# Patient Record
Sex: Male | Born: 1995 | Race: Black or African American | Hispanic: No | Marital: Single | Smoking: Never smoker
Health system: Southern US, Community
[De-identification: ages and names within clinical notes are randomized; demographics above are authoritative.]

## PROBLEM LIST (undated history)

## (undated) DIAGNOSIS — Z8619 Personal history of other infectious and parasitic diseases: Secondary | ICD-10-CM

## (undated) DIAGNOSIS — E663 Overweight: Secondary | ICD-10-CM

## (undated) DIAGNOSIS — E108 Type 1 diabetes mellitus with unspecified complications: Principal | ICD-10-CM

## (undated) DIAGNOSIS — E1065 Type 1 diabetes mellitus with hyperglycemia: Principal | ICD-10-CM

## (undated) HISTORY — DX: Personal history of other infectious and parasitic diseases: Z86.19

## (undated) HISTORY — DX: Type 1 diabetes mellitus with hyperglycemia: E10.65

## (undated) HISTORY — DX: Overweight: E66.3

## (undated) HISTORY — DX: Type 1 diabetes mellitus with unspecified complications: E10.8

---
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
## 2020-01-27 LAB — HM DIABETES EYE EXAM

## 2020-02-21 LAB — HM DIABETES EYE EXAM

## 2022-08-06 ENCOUNTER — Ambulatory Visit (INDEPENDENT_AMBULATORY_CARE_PROVIDER_SITE_OTHER): Payer: BC Managed Care – PPO | Admitting: Family Medicine

## 2022-08-06 VITALS — BP 130/90 | HR 70 | Temp 98.7°F | Ht 69.29 in | Wt 194.3 lb

## 2022-08-06 DIAGNOSIS — R7303 Prediabetes: Secondary | ICD-10-CM | POA: Diagnosis not present

## 2022-08-06 DIAGNOSIS — Z8639 Personal history of other endocrine, nutritional and metabolic disease: Secondary | ICD-10-CM | POA: Diagnosis not present

## 2022-08-06 LAB — POCT GLYCOSYLATED HEMOGLOBIN (HGB A1C): Hemoglobin A1C: 5.8 % — AB (ref 4.0–5.6)

## 2022-08-06 NOTE — Progress Notes (Unsigned)
New Patient Office Visit  Subjective    Patient ID: [name] [name], male    DOB: 04-15-1996  Age: 27 y.o. MRN: [id number]  CC:  Chief Complaint  Patient presents with   New Patient (Initial Visit)    HPI [name] [name] presents to establish care Patient states that he used to be a diabetic. Was diagnosed in 2014, states that initially thought he was a type 1, then changed to type 2, then he reports he lost weight and his diabetes resolved. States that he occasionally checks blood sugar at home, no longer on medication.  A1C performed in office today and is 5.8. he reports he continues to eat a low carb diet and is doing well with this.   He reports no other medical issues and is not currently taking any prescribed medications.   I have reviewed all aspects of the patient's past medical, social, and surgical history.   Outpatient Encounter Medications as of 08/06/2022  Medication Sig   ACCU-CHEK FASTCLIX LANCETS MISC 1 each by Does not apply route daily. And lancets 1/day   acetone, urine, test strip Check ketones per protocol   glucagon 1 MG injection Use for Severe Hypoglycemia . Inject '1mg'$  intramuscularly if unresponsive, unable to swallow, unconscious and/or has seizure   No facility-administered encounter medications on file as of 08/06/2022.    Past Medical History:  Diagnosis Date   H/O varicella    also had vaccine   Overweight(278.02)    Type I (juvenile type) diabetes mellitus with unspecified complication, uncontrolled 04/02/2013   with  acidosis hosp    No past surgical history on file.  Family History  Problem Relation Age of Onset   Hypertension Mother    Asthma Father    Renal Disease Maternal Grandmother        on dialysis  recently acutely   Diabetes Maternal Grandmother     Social History   Socioeconomic History   Marital status: Single    Spouse name: n/a   Number of children: 0   Years of education: Not on file   Highest education level: Not on  file  Occupational History   Occupation: student  Tobacco Use   Smoking status: Never   Smokeless tobacco: Never  Substance and Sexual Activity   Alcohol use: No   Drug use: No   Sexual activity: Never  Other Topics Concern   Not on file  Social History Narrative   Lives with both parents.  His two half-sisters (one from each parent) are grown and live away.    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  Thinking about football next year.      hh of 3  No pets.             Social Determinants of Health   Financial Resource Strain: Not on file  Food Insecurity: Not on file  Transportation Needs: Not on file  Physical Activity: Not on file  Stress: Not on file  Social Connections: Not on file  Intimate Partner Violence: Not on file    Review of Systems  All other systems reviewed and are negative.       Objective    BP (!) 130/90 (BP Location: Left Arm, Patient Position: Sitting, Cuff Size: Normal)   Pulse  70   Temp 98.7 F (37.1 C) (Oral)   Ht 5' 9.29" (1.76 m)   Wt 194 lb 4.8 oz (88.1 kg)   SpO2 99%   BMI 28.45 kg/m   Physical Exam Vitals reviewed.  Constitutional:      Appearance: Normal appearance. He is well-groomed and normal weight.  Eyes:     Extraocular Movements: Extraocular movements intact.     Conjunctiva/sclera: Conjunctivae normal.  Neck:     Thyroid: No thyromegaly.  Cardiovascular:     Rate and Rhythm: Normal rate and regular rhythm.     Heart sounds: S1 normal and S2 normal. No murmur heard. Pulmonary:     Effort: Pulmonary effort is normal.     Breath sounds: Normal breath sounds and air entry. No rales.  Abdominal:     General: Abdomen is flat. Bowel sounds are normal.  Musculoskeletal:     Right lower leg: No edema.     Left lower leg: No edema.  Neurological:     General: No focal  deficit present.     Mental Status: He is alert and oriented to person, place, and time.     Gait: Gait is intact.  Psychiatric:        Mood and Affect: Mood and affect normal.         Assessment & Plan:   Problem List Items Addressed This Visit       Unprioritized   Prediabetes (Chronic)    Patient is well controlled on dietary measures only. At this point he only qualifies as a prediabetic NOT a full diabetic. I will order CMP and urine microalbumin test.       Other Visit Diagnoses     History of diabetes as a child    -  Primary   Relevant Orders   POC HgB A1c (Completed)   CMP (Completed)   Microalbumin/Creatinine Ratio, Urine (Completed)       Return in about 1 year (around 08/07/2023) for annual physical exam.   Farrel Conners, MD

## 2022-08-07 DIAGNOSIS — R7303 Prediabetes: Secondary | ICD-10-CM | POA: Insufficient documentation

## 2022-08-07 LAB — COMPREHENSIVE METABOLIC PANEL
ALT: 13 U/L (ref 0–53)
AST: 20 U/L (ref 0–37)
Albumin: 4.1 g/dL (ref 3.5–5.2)
Alkaline Phosphatase: 63 U/L (ref 39–117)
BUN: 8 mg/dL (ref 6–23)
CO2: 27 mEq/L (ref 19–32)
Calcium: 9.6 mg/dL (ref 8.4–10.5)
Chloride: 103 mEq/L (ref 96–112)
Creatinine, Ser: 0.75 mg/dL (ref 0.40–1.50)
GFR: 125.41 mL/min (ref >60.00)
Glucose, Bld: 81 mg/dL (ref 70–99)
Potassium: 4.1 mEq/L (ref 3.5–5.1)
Sodium: 137 mEq/L (ref 135–145)
Total Bilirubin: 0.6 mg/dL (ref 0.2–1.2)
Total Protein: 8.2 g/dL (ref 6.0–8.3)

## 2022-08-07 LAB — MICROALBUMIN / CREATININE URINE RATIO
Creatinine,U: 207.4 mg/dL
Microalb Creat Ratio: 0.6 mg/g (ref 0.0–30.0)
Microalb, Ur: 1.3 mg/dL (ref 0.0–1.9)

## 2022-08-07 NOTE — Assessment & Plan Note (Signed)
Patient is well controlled on dietary measures only. At this point he only qualifies as a prediabetic NOT a full diabetic. I will order CMP and urine microalbumin test.

## 2023-08-19 ENCOUNTER — Ambulatory Visit: Payer: BC Managed Care – PPO | Admitting: Family Medicine

## 2023-09-03 ENCOUNTER — Encounter: Admitting: Family Medicine

## 2023-09-04 ENCOUNTER — Encounter: Admitting: Family Medicine

## 2023-10-07 ENCOUNTER — Encounter: Admitting: Family Medicine

## 2023-11-04 ENCOUNTER — Encounter: Payer: Self-pay | Admitting: Family Medicine

## 2023-11-04 ENCOUNTER — Ambulatory Visit (INDEPENDENT_AMBULATORY_CARE_PROVIDER_SITE_OTHER): Payer: Self-pay | Admitting: Family Medicine

## 2023-11-04 VITALS — BP 120/70 | HR 55 | Temp 97.7°F | Ht 70.5 in | Wt 172.9 lb

## 2023-11-04 DIAGNOSIS — R7303 Prediabetes: Secondary | ICD-10-CM

## 2023-11-04 DIAGNOSIS — Z13220 Encounter for screening for lipoid disorders: Secondary | ICD-10-CM

## 2023-11-04 DIAGNOSIS — Z0001 Encounter for general adult medical examination with abnormal findings: Secondary | ICD-10-CM

## 2023-11-04 DIAGNOSIS — E010 Iodine-deficiency related diffuse (endemic) goiter: Secondary | ICD-10-CM

## 2023-11-04 LAB — CBC WITH DIFFERENTIAL/PLATELET
Basophils Absolute: 0 10*3/uL (ref 0.0–0.1)
Basophils Relative: 1.3 % (ref 0.0–3.0)
Eosinophils Absolute: 0.1 10*3/uL (ref 0.0–0.7)
Eosinophils Relative: 2.4 % (ref 0.0–5.0)
HCT: 41.1 % (ref 39.0–52.0)
Hemoglobin: 13.8 g/dL (ref 13.0–17.0)
Lymphocytes Relative: 43.9 % (ref 12.0–46.0)
Lymphs Abs: 1.5 10*3/uL (ref 0.7–4.0)
MCHC: 33.5 g/dL (ref 30.0–36.0)
MCV: 84.4 fl (ref 78.0–100.0)
Monocytes Absolute: 0.4 10*3/uL (ref 0.1–1.0)
Monocytes Relative: 11 % (ref 3.0–12.0)
Neutro Abs: 1.4 10*3/uL (ref 1.4–7.7)
Neutrophils Relative %: 41.4 % — ABNORMAL LOW (ref 43.0–77.0)
Platelets: 144 10*3/uL — ABNORMAL LOW (ref 150.0–400.0)
RBC: 4.86 Mil/uL (ref 4.22–5.81)
RDW: 13 % (ref 11.5–15.5)
WBC: 3.3 10*3/uL — ABNORMAL LOW (ref 4.0–10.5)

## 2023-11-04 LAB — LIPID PANEL
Cholesterol: 134 mg/dL (ref 0–200)
HDL: 55.4 mg/dL (ref >39.00)
LDL Cholesterol: 72 mg/dL (ref 0–99)
NonHDL: 78.69
Total CHOL/HDL Ratio: 2
Triglycerides: 31 mg/dL (ref 0.0–149.0)
VLDL: 6.2 mg/dL (ref 0.0–40.0)

## 2023-11-04 LAB — COMPREHENSIVE METABOLIC PANEL WITH GFR
ALT: 13 U/L (ref 0–53)
AST: 16 U/L (ref 0–37)
Albumin: 4.5 g/dL (ref 3.5–5.2)
Alkaline Phosphatase: 39 U/L (ref 39–117)
BUN: 10 mg/dL (ref 6–23)
CO2: 25 meq/L (ref 19–32)
Calcium: 9.3 mg/dL (ref 8.4–10.5)
Chloride: 102 meq/L (ref 96–112)
Creatinine, Ser: 0.73 mg/dL (ref 0.40–1.50)
GFR: 125.34 mL/min (ref >60.00)
Glucose, Bld: 79 mg/dL (ref 70–99)
Potassium: 4.3 meq/L (ref 3.5–5.1)
Sodium: 135 meq/L (ref 135–145)
Total Bilirubin: 1.7 mg/dL — ABNORMAL HIGH (ref 0.2–1.2)
Total Protein: 7.8 g/dL (ref 6.0–8.3)

## 2023-11-04 LAB — POCT GLYCOSYLATED HEMOGLOBIN (HGB A1C): Hemoglobin A1C: 5.5 % (ref 4.0–5.6)

## 2023-11-04 LAB — TSH: TSH: 1.96 u[IU]/mL (ref 0.35–5.50)

## 2023-11-04 NOTE — Patient Instructions (Signed)
 Health Maintenance, Male  Adopting a healthy lifestyle and getting preventive care are important in promoting health and wellness. Ask your health care provider about:  The right schedule for you to have regular tests and exams.  Things you can do on your own to prevent diseases and keep yourself healthy.  What should I know about diet, weight, and exercise?  Eat a healthy diet    Eat a diet that includes plenty of vegetables, fruits, low-fat dairy products, and lean protein.  Do not eat a lot of foods that are high in solid fats, added sugars, or sodium.  Maintain a healthy weight  Body mass index (BMI) is a measurement that can be used to identify possible weight problems. It estimates body fat based on height and weight. Your health care provider can help determine your BMI and help you achieve or maintain a healthy weight.  Get regular exercise  Get regular exercise. This is one of the most important things you can do for your health. Most adults should:  Exercise for at least 150 minutes each week. The exercise should increase your heart rate and make you sweat (moderate-intensity exercise).  Do strengthening exercises at least twice a week. This is in addition to the moderate-intensity exercise.  Spend less time sitting. Even light physical activity can be beneficial.  Watch cholesterol and blood lipids  Have your blood tested for lipids and cholesterol at 27 years of age, then have this test every 5 years.  You may need to have your cholesterol levels checked more often if:  Your lipid or cholesterol levels are high.  You are older than 27 years of age.  You are at high risk for heart disease.  What should I know about cancer screening?  Many types of cancers can be detected early and may often be prevented. Depending on your health history and family history, you may need to have cancer screening at various ages. This may include screening for:  Colorectal cancer.  Prostate cancer.  Skin cancer.  Lung  cancer.  What should I know about heart disease, diabetes, and high blood pressure?  Blood pressure and heart disease  High blood pressure causes heart disease and increases the risk of stroke. This is more likely to develop in people who have high blood pressure readings or are overweight.  Talk with your health care provider about your target blood pressure readings.  Have your blood pressure checked:  Every 3-5 years if you are 9-95 years of age.  Every year if you are 85 years old or older.  If you are between the ages of 29 and 29 and are a current or former smoker, ask your health care provider if you should have a one-time screening for abdominal aortic aneurysm (AAA).  Diabetes  Have regular diabetes screenings. This checks your fasting blood sugar level. Have the screening done:  Once every three years after age 23 if you are at a normal weight and have a low risk for diabetes.  More often and at a younger age if you are overweight or have a high risk for diabetes.  What should I know about preventing infection?  Hepatitis B  If you have a higher risk for hepatitis B, you should be screened for this virus. Talk with your health care provider to find out if you are at risk for hepatitis B infection.  Hepatitis C  Blood testing is recommended for:  Everyone born from [birth year] through 1964.  Anyone  with known risk factors for hepatitis C.  Sexually transmitted infections (STIs)  You should be screened each year for STIs, including gonorrhea and chlamydia, if:  You are sexually active and are younger than 27 years of age.  You are older than 27 years of age and your health care provider tells you that you are at risk for this type of infection.  Your sexual activity has changed since you were last screened, and you are at increased risk for chlamydia or gonorrhea. Ask your health care provider if you are at risk.  Ask your health care provider about whether you are at high risk for HIV. Your health care provider  may recommend a prescription medicine to help prevent HIV infection. If you choose to take medicine to prevent HIV, you should first get tested for HIV. You should then be tested every 3 months for as long as you are taking the medicine.  Follow these instructions at home:  Alcohol use  Do not drink alcohol if your health care provider tells you not to drink.  If you drink alcohol:  Limit how much you have to 0-2 drinks a day.  Know how much alcohol is in your drink. In the [place] one drink equals one 12 oz bottle of beer (355 mL), one 5 oz glass of wine (148 mL), or one 1 oz glass of hard liquor (44 mL).  Lifestyle  Do not use any products that contain nicotine or tobacco. These products include cigarettes, chewing tobacco, and vaping devices, such as e-cigarettes. If you need help quitting, ask your health care provider.  Do not use street drugs.  Do not share needles.  Ask your health care provider for help if you need support or information about quitting drugs.  General instructions  Schedule regular health, dental, and eye exams.  Stay current with your vaccines.  Tell your health care provider if:  You often feel depressed.  You have ever been abused or do not feel safe at home.  Summary  Adopting a healthy lifestyle and getting preventive care are important in promoting health and wellness.  Follow your health care provider's instructions about healthy diet, exercising, and getting tested or screened for diseases.  Follow your health care provider's instructions on monitoring your cholesterol and blood pressure.  This information is not intended to replace advice given to you by your health care provider. Make sure you discuss any questions you have with your health care provider.  Document Revised: 10/16/2020 Document Reviewed: 10/16/2020  Elsevier Patient Education  2024 [employer].

## 2023-11-04 NOTE — Progress Notes (Signed)
 Complete physical exam  Patient: [name] [name]   DOB: [birth year]   27 y.o. Male  MRN: [id number]  Subjective:     Chief Complaint  Patient presents with   Annual Exam    [name] [name] is a 28 y.o. male who presents today for a complete physical exam. He reports consuming a low sugar low sodium  diet. Home exercise routine includes walking 1 hrs per day. He generally feels well. He reports sleeping well. He does not have additional problems to discuss today.    Most recent fall risk assessment:     No data to display           Most recent depression screenings:    11/04/2023    2:19 PM  PHQ 2/9 Scores  PHQ - 2 Score 0  PHQ- 9 Score 0    Vision:last vision exam about 4 years ago, no trouble with vision currently and Dental: No current dental problems and Receives regular dental care  Patient Active Problem List   Diagnosis Date Noted   Prediabetes 08/07/2022   Well adolescent visit 05/04/2013   Goiter 04/02/2013   Abnormal thyroid  blood test 04/02/2013   Acquired acanthosis nigricans 04/02/2013   Dehydration 04/02/2013   Adjustment reaction 04/02/2013   BMI (body mass index), pediatric, > 99% for age 75/17/2013      Patient Care Team: Aida House, MD as PCP - General (Family Medicine) Ovidio Blower, MD as Consulting Physician (Pediatrics)   Outpatient Medications Prior to Visit  Medication Sig   ACCU-CHEK FASTCLIX LANCETS MISC 1 each by Does not apply route daily. And lancets 1/day   acetone, urine, test strip Check ketones per protocol   [DISCONTINUED] glucagon  1 MG injection Use for Severe Hypoglycemia . Inject 1mg  intramuscularly if unresponsive, unable to swallow, unconscious and/or has seizure   No facility-administered medications prior to visit.    Review of Systems  HENT:  Negative for hearing loss.   Eyes:  Negative for blurred vision.  Respiratory:  Negative for shortness of breath.   Cardiovascular:  Negative for chest pain.   Gastrointestinal: Negative.   Genitourinary: Negative.   Musculoskeletal:  Negative for back pain.  Neurological:  Negative for headaches.  Psychiatric/Behavioral:  Negative for depression.   All other systems reviewed and are negative.      Objective:     BP 120/70   Pulse (!) 55   Temp 97.7 F (36.5 C) (Oral)   Ht 5' 10.5" (1.791 m)   Wt 172 lb 14.4 oz (78.4 kg)   SpO2 99%   BMI 24.46 kg/m    Physical Exam Vitals reviewed.  Constitutional:      Appearance: Normal appearance. He is well-groomed and normal weight.  HENT:     Right Ear: Tympanic membrane and ear canal normal.     Left Ear: Tympanic membrane and ear canal normal.     Mouth/Throat:     Mouth: Mucous membranes are moist.     Pharynx: No posterior oropharyngeal erythema.  Eyes:     Extraocular Movements: Extraocular movements intact.     Conjunctiva/sclera: Conjunctivae normal.  Neck:     Thyroid : Thyromegaly (left lobe is larger than the right) present.  Cardiovascular:     Rate and Rhythm: Normal rate and regular rhythm.     Heart sounds: S1 normal and S2 normal. No murmur heard. Pulmonary:     Effort: Pulmonary effort is normal.     Breath sounds: Normal breath  sounds and air entry. No rales.  Abdominal:     General: Abdomen is flat. Bowel sounds are normal.  Musculoskeletal:     Right lower leg: No edema.     Left lower leg: No edema.  Lymphadenopathy:     Cervical: No cervical adenopathy.  Neurological:     General: No focal deficit present.     Mental Status: He is alert and oriented to person, place, and time.     Gait: Gait is intact.  Psychiatric:        Mood and Affect: Mood and affect normal.      Results for orders placed or performed in visit on 11/04/23  POC HgB A1c  Result Value Ref Range   Hemoglobin A1C 5.5 4.0 - 5.6 %   HbA1c POC (<> result, manual entry)     HbA1c, POC (prediabetic range)     HbA1c, POC (controlled diabetic range)         Assessment & Plan:     Routine Health Maintenance and Physical Exam  Immunization History  Administered Date(s) Administered   Influenza,inj,Quad PF,6+ Mos 03/31/2013, 04/26/2014, 05/10/2015    Health Maintenance  Topic Date Due   HPV VACCINES (1 - Male 3-dose series) Never done   Pneumococcal Vaccine 5-87 Years old (1 of 2 - PCV) Never done   COVID-19 Vaccine (1 - 2024-25 season) Never done   Diabetic kidney evaluation - eGFR measurement  08/07/2023   Diabetic kidney evaluation - Urine ACR  08/07/2023   DTaP/Tdap/Td (1 - Tdap) 11/03/2024 (Originally 01/29/2016)   Hepatitis C Screening  11/03/2024 (Originally 01/29/2015)   HIV Screening  11/03/2024 (Originally 01/29/2012)   INFLUENZA VACCINE  01/09/2024   HEMOGLOBIN A1C  05/06/2024   Meningococcal B Vaccine  Aged Out   FOOT EXAM  Discontinued   OPHTHALMOLOGY EXAM  Discontinued    Discussed health benefits of physical activity, and encouraged him to engage in regular exercise appropriate for his age and condition.  Encounter for general adult medical examination with abnormal findings  Prediabetes -     POCT glycosylated hemoglobin (Hb A1C) -     Collection capillary blood specimen -     Comprehensive metabolic panel with GFR; Future -     CBC with Differential/Platelet; Future  Lipid screening -     Lipid panel; Future  Thyromegaly -     TSH; Future -     Thyroid  Peroxidase Antibodies (TPO) (REFL); Future -     Thyroglobulin antibody; Future  On exam there is some thyromegaly on the left lobe felt on exam today. Patient is not reporting any symptoms of hypo or hyperthyroidism. Will check TSH and labs to rule out pathology.   Otherwise the physical exam findings are WNL today. I have counseled the patient on increasing exercise to 150 minutes weekly. Handouts given on healthy eating and exercise. Labs ordered for annual surveillance of cardiac risk and overall general organ health.   Return in 1 year (on 11/03/2024).     Aida House,  MD

## 2023-11-05 LAB — THYROGLOBULIN ANTIBODY: Thyroglobulin Ab: <1 [IU]/mL (ref <=1)

## 2023-11-05 LAB — THYROID PEROXIDASE ANTIBODIES (TPO) (REFL): Thyroperoxidase Ab SerPl-aCnc: 1 [IU]/mL (ref <9)

## 2023-11-07 ENCOUNTER — Ambulatory Visit: Payer: Self-pay | Admitting: Family Medicine

## 2023-11-07 NOTE — Progress Notes (Signed)
 Pt has new findings of low white count as well as low platelets (although it looks like this was present about 10 years ago on labs.) also his bilirubin level is high. I am concerned about something going on in the blood stream, or certain infections, or even liver disease. I would like to order some additional labs and and ultrasound of his liver if that's ok withhim. Please ask if he is agreeable to additional labs.

## 2023-11-10 ENCOUNTER — Telehealth: Payer: Self-pay | Admitting: *Deleted

## 2023-11-10 NOTE — Telephone Encounter (Signed)
 See results note.

## 2023-11-10 NOTE — Telephone Encounter (Signed)
 Copied from CRM [id number]. Topic: Clinical - Lab/Test Results >> Nov 10, 2023  4:02 PM Caliyah H wrote: Reason for CRM: Patient is returning the missed call from today regarding lab results on 11/04/23.

## 2023-11-11 ENCOUNTER — Telehealth: Payer: Self-pay

## 2023-11-11 DIAGNOSIS — D709 Neutropenia, unspecified: Secondary | ICD-10-CM

## 2023-11-11 NOTE — Addendum Note (Signed)
 Addended by: Elener Custodio M on: 11/11/2023 11:34 AM   Modules accepted: Orders

## 2023-11-11 NOTE — Telephone Encounter (Signed)
 Copied from CRM [id number]. Topic: Clinical - Lab/Test Results >> Nov 11, 2023 10:20 AM Sophia H wrote: Reason for CRM: Patient mother is calling in to get orders for additional lab work/ultrasound of liver. Please reach out to pt mother when orders are in, Ty.   Pt mother Neoma Banker - [phone]

## 2023-11-11 NOTE — Telephone Encounter (Signed)
 Orders placed, he will need a lab appt

## 2023-11-11 NOTE — Telephone Encounter (Signed)
 Spoke with the patient's mother and scheduled a lab appt on 6/17.

## 2023-11-13 ENCOUNTER — Ambulatory Visit
Admission: RE | Admit: 2023-11-13 | Discharge: 2023-11-13 | Discharge status: Home / Self Care | Payer: Self-pay | Source: Ambulatory Visit | Attending: Family Medicine

## 2023-11-17 ENCOUNTER — Ambulatory Visit: Payer: Self-pay | Admitting: Family Medicine

## 2023-11-17 DIAGNOSIS — D696 Thrombocytopenia, unspecified: Secondary | ICD-10-CM

## 2023-11-17 NOTE — Progress Notes (Signed)
 Liver US  is normal.

## 2023-11-25 ENCOUNTER — Other Ambulatory Visit (INDEPENDENT_AMBULATORY_CARE_PROVIDER_SITE_OTHER): Payer: Self-pay

## 2023-11-25 DIAGNOSIS — D709 Neutropenia, unspecified: Secondary | ICD-10-CM

## 2023-11-25 LAB — PROTIME-INR
INR: 1.3 ratio — ABNORMAL HIGH (ref 0.8–1.0)
Prothrombin Time: 14 s — ABNORMAL HIGH (ref 9.6–13.1)

## 2023-11-26 LAB — HEPATITIS PANEL, ACUTE
Hep A IgM: NONREACTIVE
Hep B C IgM: NONREACTIVE
Hepatitis B Surface Ag: NONREACTIVE
Hepatitis C Ab: NONREACTIVE

## 2023-11-26 LAB — LACTATE DEHYDROGENASE: LDH: 96 U/L — ABNORMAL LOW (ref 100–220)

## 2023-11-26 LAB — HIV ANTIBODY (ROUTINE TESTING W REFLEX): HIV 1&2 Ab, 4th Generation: NONREACTIVE

## 2023-11-28 ENCOUNTER — Telehealth: Payer: Self-pay

## 2023-11-28 NOTE — Telephone Encounter (Signed)
 Copied from CRM [id number]. Topic: General - Other >> Nov 28, 2023  1:23 PM Trula Gable C wrote: Reason for CRM: Patient called in regarding a missed call, would like for a callback

## 2023-11-28 NOTE — Telephone Encounter (Signed)
Patient called back for an update. Please advise.

## 2023-12-01 NOTE — Telephone Encounter (Signed)
 See results note.

## 2023-12-24 ENCOUNTER — Inpatient Hospital Stay: Payer: Self-pay | Attending: Oncology

## 2023-12-24 ENCOUNTER — Inpatient Hospital Stay (HOSPITAL_BASED_OUTPATIENT_CLINIC_OR_DEPARTMENT_OTHER): Payer: Self-pay | Admitting: Hematology and Oncology

## 2023-12-24 VITALS — BP 131/80 | HR 60 | Temp 98.4°F | Resp 24 | Ht 70.5 in | Wt 180.0 lb

## 2023-12-24 DIAGNOSIS — D72819 Decreased white blood cell count, unspecified: Secondary | ICD-10-CM | POA: Insufficient documentation

## 2023-12-24 DIAGNOSIS — D696 Thrombocytopenia, unspecified: Secondary | ICD-10-CM | POA: Insufficient documentation

## 2023-12-24 DIAGNOSIS — D709 Neutropenia, unspecified: Secondary | ICD-10-CM

## 2023-12-24 LAB — CBC WITH DIFFERENTIAL (CANCER CENTER ONLY)
Abs Immature Granulocytes: 0.01 K/uL (ref 0.00–0.07)
Basophils Absolute: 0 K/uL (ref 0.0–0.1)
Basophils Relative: 1 %
Eosinophils Absolute: 0.1 K/uL (ref 0.0–0.5)
Eosinophils Relative: 4 %
HCT: 36.9 % — ABNORMAL LOW (ref 39.0–52.0)
Hemoglobin: 12.5 g/dL — ABNORMAL LOW (ref 13.0–17.0)
Immature Granulocytes: 0 %
Lymphocytes Relative: 42 %
Lymphs Abs: 1.5 K/uL (ref 0.7–4.0)
MCH: 28.3 pg (ref 26.0–34.0)
MCHC: 33.9 g/dL (ref 30.0–36.0)
MCV: 83.7 fL (ref 80.0–100.0)
Monocytes Absolute: 0.4 K/uL (ref 0.1–1.0)
Monocytes Relative: 11 %
Neutro Abs: 1.5 K/uL — ABNORMAL LOW (ref 1.7–7.7)
Neutrophils Relative %: 42 %
Platelet Count: 119 K/uL — ABNORMAL LOW (ref 150–400)
RBC: 4.41 MIL/uL (ref 4.22–5.81)
RDW: 12.7 % (ref 11.5–15.5)
WBC Count: 3.6 K/uL — ABNORMAL LOW (ref 4.0–10.5)
nRBC: 0 % (ref 0.0–0.2)

## 2023-12-24 LAB — CMP (CANCER CENTER ONLY)
ALT: 25 U/L (ref 0–44)
AST: 19 U/L (ref 15–41)
Albumin: 4 g/dL (ref 3.5–5.0)
Alkaline Phosphatase: 40 U/L (ref 38–126)
Anion gap: 5 (ref 5–15)
BUN: 7 mg/dL (ref 6–20)
CO2: 29 mmol/L (ref 22–32)
Calcium: 8.9 mg/dL (ref 8.9–10.3)
Chloride: 105 mmol/L (ref 98–111)
Creatinine: 0.68 mg/dL (ref 0.61–1.24)
GFR, Estimated: >60 mL/min (ref >60)
Glucose, Bld: 86 mg/dL (ref 70–99)
Potassium: 3.8 mmol/L (ref 3.5–5.1)
Sodium: 139 mmol/L (ref 135–145)
Total Bilirubin: 1.1 mg/dL (ref 0.0–1.2)
Total Protein: 7.2 g/dL (ref 6.5–8.1)

## 2023-12-24 LAB — VITAMIN B12: Vitamin B-12: 126 pg/mL — ABNORMAL LOW (ref 180–914)

## 2023-12-24 NOTE — Progress Notes (Signed)
 [facility] CONSULT NOTE  Patient Care Team: Ozell Heron HERO, MD as PCP - General (Family Medicine) Dorrene Nest, MD as Consulting Physician (Pediatrics)  CHIEF COMPLAINTS/PURPOSE OF CONSULTATION:  Leukopenia and thrombocytopenia consultation  HISTORY OF PRESENTING ILLNESS:   History of Present Illness [name] [name] is a 28 year old male who presents for evaluation of low white blood cell and platelet counts. He is accompanied by his mother. He was referred by another doctor for evaluation of his blood work showing low white blood cell and platelet counts.  Recent blood work shows a white blood cell count of 3.3 (normal range 4-11) and a platelet count of 144 (normal range 150-400). This is the first occurrence of such abnormalities, with previous counts being normal. There are no recent infections, antibiotic use, or symptoms of viral illnesses.  He has diabetes, diagnosed in 2014, which is managed through diet and weight loss. He was previously nearly 300 pounds but has since lost weight by eating salads and fruits and choosing water or milk as beverages. His diabetes is well-controlled.     I reviewed her records extensively and collaborated the history with the patient.  SUMMARY OF ONCOLOGIC HISTORY: Oncology History   No history exists.     MEDICAL HISTORY:  Past Medical History:  Diagnosis Date   H/O varicella    also had vaccine   Overweight(278.02)    Type I (juvenile type) diabetes mellitus with unspecified complication, uncontrolled 04/02/2013   with  acidosis hosp    SURGICAL HISTORY: No past surgical history on file.  SOCIAL HISTORY: Social History   Socioeconomic History   Marital status: Single    Spouse name: n/a   Number of children: 0   Years of education: Not on file   Highest education level: Not on file  Occupational History   Occupation: student  Tobacco Use   Smoking status: Never   Smokeless tobacco: Never  Substance  and Sexual Activity   Alcohol use: No   Drug use: No   Sexual activity: Never  Other Topics Concern   Not on file  Social History Narrative   Lives with both parents.  His two half-sisters (one from each parent) are grown and live away.    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  Thinking about football next year.      hh of 3  No pets.             Social Drivers of Corporate investment banker Strain: Not on file  Food Insecurity: No Food Insecurity (12/24/2023)   Hunger Vital Sign    Worried About Running Out of Food in the Last Year: Never true    Ran Out of Food in the Last Year: Never true  Transportation Needs: No Transportation Needs (12/24/2023)   PRAPARE - Administrator, Civil Service (Medical): No    Lack of Transportation (Non-Medical): No  Physical Activity: Not on file  Stress: Not on file  Social Connections: Not on file  Intimate Partner Violence: Not on file    FAMILY HISTORY: Family History  Problem Relation Age of Onset   Hypertension Mother    Asthma Father    Renal Disease Maternal Grandmother        on dialysis  recently acutely   Diabetes Maternal Grandmother     ALLERGIES:  has no known allergies.  MEDICATIONS:  No current outpatient medications on file.   No current facility-administered medications for this visit.    REVIEW OF SYSTEMS:   Constitutional: Denies fevers, chills or abnormal night sweats All other systems were reviewed with the patient and are negative.  PHYSICAL EXAMINATION: ECOG PERFORMANCE STATUS: 1 - Symptomatic but completely ambulatory  Vitals:   12/24/23 1400  BP: 131/80  Pulse: 60  Resp: (!) 24  Temp: 98.4 F (36.9 C)  SpO2: 100%   Filed Weights   12/24/23 1400  Weight: 180 lb (81.6 kg)    GENERAL:alert, no distress and comfortable   LABORATORY  DATA:  I have reviewed the data as listed Lab Results  Component Value Date   WBC 3.6 (L) 12/24/2023   HGB 12.5 (L) 12/24/2023   HCT 36.9 (L) 12/24/2023   MCV 83.7 12/24/2023   PLT 119 (L) 12/24/2023   Lab Results  Component Value Date   NA 139 12/24/2023   K 3.8 12/24/2023   CL 105 12/24/2023   CO2 29 12/24/2023    RADIOGRAPHIC STUDIES: I have personally reviewed the radiological reports and agreed with the findings in the report.  ASSESSMENT AND PLAN:  Leukopenia Lab review 11/04/2023: WBC 3.3, hemoglobin 13.8, platelets 144, ANC 1.4, ALC 1.5, LDH 96, HIV and hepatitis panel: Negative, INR 1.3, PT 14, ultrasound liver: Normal 12/24/2023: WBC 3.6, hemoglobin 12.5, platelets 119  Discussion: I discussed with the patient extensively that the mild leukopenia and mild thrombocytopenia are not severely reduced enough to warrant a bone marrow biopsy. Possible differential diagnosis could be any recent illnesses or infections and medications.   Plan: Recheck CBC with differential Check B12 levels ANA to rule out lupus  Thrombocytopenia: No clear-cut etiology.  It could be platelet clumping.  We can watch and monitor with a recheck of labs in 3 months.  Liver ultrasound was normal and hepatitis B and HIV workup were negative.  Weight: Patient lost about 120 pounds by eating healthy.  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 Telephone visit after that to discuss results. Assessment and Plan Assessment & Plan Mild neutropenia Neutrophils mildly low, likely a normal variant. No recent infections or antibiotic use reported. - Recheck lab work to verify neutrophil count. - Examine blood smear. - Check B12 level. - Perform ANA test for lupus.  Thrombocytopenia Platelet count slightly below normal, not concerning for bleeding risk. - Recheck lab work to verify platelet count.     All questions were answered. The patient knows to call the clinic with any  problems, questions or concerns.    Viinay K Prima Rayner, MD 12/24/23

## 2023-12-24 NOTE — Assessment & Plan Note (Signed)
 Lab review 11/04/2023: WBC 3.3, hemoglobin 13.8, platelets 144, ANC 1.4, ALC 1.5, LDH 96, HIV and hepatitis panel: Negative, INR 1.3, PT 14, ultrasound liver: Normal  Discussion: I discussed with the patient extensively that the mild leukopenia and mild thrombocytopenia are not severely reduced enough to warrant a bone marrow biopsy. Possible differential diagnosis could be any recent illnesses or infections and medications.   Plan: Recheck CBC with differential Check B12 and folic acid levels ANA to rule out lupus  Telephone visit after that to discuss results.

## 2023-12-25 ENCOUNTER — Telehealth: Payer: Self-pay | Admitting: Hematology and Oncology

## 2023-12-25 LAB — ANTINUCLEAR ANTIBODIES, IFA: ANA Ab, IFA: NEGATIVE

## 2023-12-25 NOTE — Addendum Note (Signed)
 Addended by: ODEAN POTTS on: 12/25/2023 04:43 PM   Modules accepted: Orders

## 2023-12-25 NOTE — Telephone Encounter (Signed)
 I informed the patient and his mother that the WBC count is 3.6 and the platelets are 117. B12 level is 126  Treatment plan: Supplement with oral B12 5000 mcg sublingual tablets Recheck labs in 2 months and follow-up with me

## 2024-02-17 ENCOUNTER — Ambulatory Visit: Payer: Self-pay | Admitting: Family Medicine

## 2024-02-27 ENCOUNTER — Ambulatory Visit (INDEPENDENT_AMBULATORY_CARE_PROVIDER_SITE_OTHER): Payer: Self-pay | Admitting: Family Medicine

## 2024-02-27 ENCOUNTER — Encounter: Payer: Self-pay | Admitting: Family Medicine

## 2024-02-27 VITALS — BP 100/50 | HR 67 | Temp 98.5°F | Ht 70.5 in | Wt 177.7 lb

## 2024-02-27 DIAGNOSIS — D709 Neutropenia, unspecified: Secondary | ICD-10-CM

## 2024-02-27 DIAGNOSIS — E538 Deficiency of other specified B group vitamins: Secondary | ICD-10-CM

## 2024-02-27 NOTE — Progress Notes (Signed)
   Established Patient Office Visit  Subjective   Patient ID: [name] [name], male    DOB: February 09, 1996  Age: 28 y.o. MRN: [id number]  Chief Complaint  Patient presents with   Medical Management of Chronic Issues    Pt is here for follow up on his labs., he reports he saw Dr. Odean who reported that he had a B12 deficiency and that supplementation was recommended followed by repeat labs, no unexplained bruising or bleeding, no nosebleeds. Pt reports he has been taking his B12 supplements.     Current Outpatient Medications  Medication Instructions   Multiple Vitamins-Minerals (MULTIVITAMIN ADULTS PO) Daily    Patient Active Problem List   Diagnosis Date Noted   Leukopenia 12/24/2023   Prediabetes 08/07/2022   Well adolescent visit 05/04/2013   Goiter 04/02/2013   Abnormal thyroid  blood test 04/02/2013   Acquired acanthosis nigricans 04/02/2013   Dehydration 04/02/2013   Adjustment reaction 04/02/2013   BMI (body mass index), pediatric, > 99% for age 10/25/2011     Review of Systems  All other systems reviewed and are negative.     Objective:     BP (!) 100/50   Pulse 67   Temp 98.5 F (36.9 C) (Oral)   Ht 5' 10.5 (1.791 m)   Wt 177 lb 11.2 oz (80.6 kg)   SpO2 98%   BMI 25.14 kg/m    Physical Exam Vitals reviewed.  Constitutional:      Appearance: Normal appearance. He is normal weight.  Cardiovascular:     Rate and Rhythm: Normal rate and regular rhythm.     Heart sounds: Normal heart sounds.  Pulmonary:     Effort: Pulmonary effort is normal.     Breath sounds: Normal breath sounds.  Neurological:     Mental Status: He is alert and oriented to person, place, and time. Mental status is at baseline.  Psychiatric:        Mood and Affect: Mood normal.        Behavior: Behavior normal.      No results found for any visits on 02/27/24.    The ASCVD Risk score (Arnett DK, et al., 2019) failed to calculate for the following reasons:   The 2019 ASCVD  risk score is only valid for ages 73 to 68    Assessment & Plan:  Neutropenia, unspecified type (HCC) -     CBC with Differential/Platelet; Future  B12 deficiency -     Vitamin B12; Future   Will order new labs today for patient in follow up, then send to Dr. Odean for review.  No follow-ups on file.    Heron CHRISTELLA Sharper, MD

## 2024-02-28 LAB — CBC WITH DIFFERENTIAL/PLATELET
Basophils Absolute: 0 x10E3/uL (ref 0.0–0.2)
Basos: 1 %
EOS (ABSOLUTE): 0.1 x10E3/uL (ref 0.0–0.4)
Eos: 3 %
Hematocrit: 40.6 % (ref 37.5–51.0)
Hemoglobin: 13.2 g/dL (ref 13.0–17.7)
Immature Grans (Abs): 0 x10E3/uL (ref 0.0–0.1)
Immature Granulocytes: 0 %
Lymphocytes Absolute: 1.3 x10E3/uL (ref 0.7–3.1)
Lymphs: 37 %
MCH: 28.6 pg (ref 26.6–33.0)
MCHC: 32.5 g/dL (ref 31.5–35.7)
MCV: 88 fL (ref 79–97)
Monocytes Absolute: 0.4 x10E3/uL (ref 0.1–0.9)
Monocytes: 12 %
Neutrophils Absolute: 1.6 x10E3/uL (ref 1.4–7.0)
Neutrophils: 47 %
Platelets: 165 x10E3/uL (ref 150–450)
RBC: 4.61 x10E6/uL (ref 4.14–5.80)
RDW: 12.6 % (ref 11.6–15.4)
WBC: 3.5 x10E3/uL (ref 3.4–10.8)

## 2024-02-28 LAB — VITAMIN B12: Vitamin B-12: 330 pg/mL (ref 232–1245)

## 2024-02-29 ENCOUNTER — Ambulatory Visit: Payer: Self-pay | Admitting: Family Medicine

## 2024-05-12 ENCOUNTER — Inpatient Hospital Stay: Payer: Self-pay | Attending: Hematology and Oncology

## 2024-05-12 ENCOUNTER — Inpatient Hospital Stay: Payer: Self-pay | Admitting: Hematology and Oncology

## 2024-05-12 VITALS — BP 116/69 | HR 66 | Temp 97.9°F | Resp 18 | Ht 70.5 in | Wt 187.1 lb

## 2024-05-12 DIAGNOSIS — D709 Neutropenia, unspecified: Secondary | ICD-10-CM

## 2024-05-12 DIAGNOSIS — D696 Thrombocytopenia, unspecified: Secondary | ICD-10-CM | POA: Diagnosis not present

## 2024-05-12 LAB — CBC WITH DIFFERENTIAL (CANCER CENTER ONLY)
Abs Immature Granulocytes: 0.01 K/uL (ref 0.00–0.07)
Basophils Absolute: 0.1 K/uL (ref 0.0–0.1)
Basophils Relative: 1 %
Eosinophils Absolute: 0.1 K/uL (ref 0.0–0.5)
Eosinophils Relative: 2 %
HCT: 39.8 % (ref 39.0–52.0)
Hemoglobin: 13.2 g/dL (ref 13.0–17.0)
Immature Granulocytes: 0 %
Lymphocytes Relative: 46 %
Lymphs Abs: 2.3 K/uL (ref 0.7–4.0)
MCH: 28.4 pg (ref 26.0–34.0)
MCHC: 33.2 g/dL (ref 30.0–36.0)
MCV: 85.6 fL (ref 80.0–100.0)
Monocytes Absolute: 0.5 K/uL (ref 0.1–1.0)
Monocytes Relative: 9 %
Neutro Abs: 2.1 K/uL (ref 1.7–7.7)
Neutrophils Relative %: 42 %
Platelet Count: 154 K/uL (ref 150–400)
RBC: 4.65 MIL/uL (ref 4.22–5.81)
RDW: 12.7 % (ref 11.5–15.5)
WBC Count: 5.1 K/uL (ref 4.0–10.5)
nRBC: 0 % (ref 0.0–0.2)

## 2024-05-12 NOTE — Assessment & Plan Note (Signed)
 Lab review 11/04/2023: WBC 3.3, hemoglobin 13.8, platelets 144, ANC 1.4, ALC 1.5, LDH 96, HIV and hepatitis panel: Negative, INR 1.3, PT 14, ultrasound liver: Normal 12/24/2023: WBC 3.6, hemoglobin 12.5, platelets 119   Discussion: I discussed with the patient extensively that the mild leukopenia and mild thrombocytopenia are not severely reduced enough to warrant a bone marrow biopsy. Possible differential diagnosis could be any recent illnesses or infections and medications.    Lab Review: 02/27/2024: WBC 3.5, hemoglobin 13.2, platelets 165, ANC 1.6 05/12/2024:   Thrombocytopenia: Previous labs showing a platelet count of 119 could be false reading.  Liver ultrasound was normal and hepatitis B and HIV workup were negative.   Weight: Patient lost about 120 pounds by eating healthy.  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 Return to clinic in 1 year for labs and follow-up

## 2024-05-12 NOTE — Progress Notes (Signed)
 Patient Care Team: Ozell Heron HERO, MD as PCP - General (Family Medicine) Dorrene Nest, MD (Inactive) as Consulting Physician (Pediatrics)  DIAGNOSIS:  Encounter Diagnosis  Name Primary?   Neutropenia, unspecified type Yes    CHIEF COMPLIANT: Follow-up regarding leukopenia and thrombocytopenia  HISTORY OF PRESENT ILLNESS:  History of Present Illness [name] [name] is a 28 year old male who presents for follow-up of his hematological status.  His white blood cell and platelet counts have been consistently normal, with a prior abnormal value due to clumping error. He takes vitamin B12 supplements, which he feels help maintain his blood counts. He has no current hematologic symptoms or concerns and feels well overall.     ALLERGIES:  has no known allergies.  MEDICATIONS:  Current Outpatient Medications  Medication Sig Dispense Refill   Multiple Vitamins-Minerals (MULTIVITAMIN ADULTS PO) Take by mouth daily.     No current facility-administered medications for this visit.    PHYSICAL EXAMINATION: ECOG PERFORMANCE STATUS: 1 - Symptomatic but completely ambulatory  Vitals:   05/12/24 1000  BP: 116/69  Pulse: 66  Resp: 18  Temp: 97.9 F (36.6 C)  SpO2: 100%   Filed Weights   05/12/24 1000  Weight: 187 lb 1.6 oz (84.9 kg)    Physical Exam   (exam performed in the presence of a chaperone)  LABORATORY DATA:  I have reviewed the data as listed    Latest Ref Rng & Units 12/24/2023    2:57 PM 11/04/2023    3:04 PM 08/06/2022    3:27 PM  CMP  Glucose 70 - 99 mg/dL 86  79  81   BUN 6 - 20 mg/dL 7  10  8    Creatinine 0.61 - 1.24 mg/dL 9.31  9.26  9.24   Sodium 135 - 145 mmol/L 139  135  137   Potassium 3.5 - 5.1 mmol/L 3.8  4.3  4.1   Chloride 98 - 111 mmol/L 105  102  103   CO2 22 - 32 mmol/L 29  25  27    Calcium 8.9 - 10.3 mg/dL 8.9  9.3  9.6   Total Protein 6.5 - 8.1 g/dL 7.2  7.8  8.2   Total Bilirubin 0.0 - 1.2 mg/dL 1.1  1.7  0.6   Alkaline Phos 38 -  126 U/L 40  39  63   AST 15 - 41 U/L 19  16  20    ALT 0 - 44 U/L 25  13  13      Lab Results  Component Value Date   WBC 5.1 05/12/2024   HGB 13.2 05/12/2024   HCT 39.8 05/12/2024   MCV 85.6 05/12/2024   PLT 154 05/12/2024   NEUTROABS 2.1 05/12/2024    ASSESSMENT & PLAN:  Leukopenia Lab review 11/04/2023: WBC 3.3, hemoglobin 13.8, platelets 144, ANC 1.4, ALC 1.5, LDH 96, HIV and hepatitis panel: Negative, INR 1.3, PT 14, ultrasound liver: Normal 12/24/2023: WBC 3.6, hemoglobin 12.5, platelets 119   Discussion: I discussed with the patient extensively that the mild leukopenia and mild thrombocytopenia are not severely reduced enough to warrant a bone marrow biopsy. Possible differential diagnosis could be any recent illnesses or infections and medications.    Lab Review: 02/27/2024: WBC 3.5, hemoglobin 13.2, platelets 165, ANC 1.6 05/12/2024: WBC 5.1, hemoglobin 13.2, ANC 2.1, platelets 154   Thrombocytopenia: Previous labs showing a platelet count of 119 could be false reading.  Liver ultrasound was normal and hepatitis B and HIV workup were  negative.   Weight: Patient lost about 120 pounds by eating healthy.  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 Continue with B12 supplements Based upon the above results do not think there is any hematological disorder and I can see him on an as-needed basis.     No orders of the defined types were placed in this encounter.  The patient has a good understanding of the overall plan. he agrees with it. he will call with any problems that may develop before the next visit here.  I personally spent a total of 30 minutes in the care of the patient today including preparing to see the patient, getting/reviewing separately obtained history, performing a medically appropriate exam/evaluation, counseling and educating, placing orders, referring and communicating with other health care professionals, documenting clinical information  in the EHR, independently interpreting results, communicating results, and coordinating care.   Viinay K Jerald Hennington, MD 05/12/24
